# Patient Record
Sex: Male | Born: 1955 | Race: White | Hispanic: No | Smoking: Current every day smoker
Health system: Southern US, Community
[De-identification: ages and names within clinical notes are randomized; demographics above are authoritative.]

## PROBLEM LIST (undated history)

## (undated) DIAGNOSIS — Z87442 Personal history of urinary calculi: Secondary | ICD-10-CM

## (undated) DIAGNOSIS — R519 Headache, unspecified: Secondary | ICD-10-CM

## (undated) DIAGNOSIS — F17200 Nicotine dependence, unspecified, uncomplicated: Secondary | ICD-10-CM

## (undated) DIAGNOSIS — Z8709 Personal history of other diseases of the respiratory system: Secondary | ICD-10-CM

## (undated) DIAGNOSIS — R51 Headache: Secondary | ICD-10-CM

## (undated) DIAGNOSIS — J449 Chronic obstructive pulmonary disease, unspecified: Secondary | ICD-10-CM

## (undated) DIAGNOSIS — Z9289 Personal history of other medical treatment: Secondary | ICD-10-CM

## (undated) DIAGNOSIS — G43909 Migraine, unspecified, not intractable, without status migrainosus: Secondary | ICD-10-CM

## (undated) DIAGNOSIS — Z8619 Personal history of other infectious and parasitic diseases: Secondary | ICD-10-CM

## (undated) HISTORY — DX: Personal history of urinary calculi: Z87.442

## (undated) HISTORY — DX: Headache, unspecified: R51.9

## (undated) HISTORY — PX: OTHER SURGICAL HISTORY: SHX169

## (undated) HISTORY — DX: Chronic obstructive pulmonary disease, unspecified: J44.9

## (undated) HISTORY — DX: Migraine, unspecified, not intractable, without status migrainosus: G43.909

## (undated) HISTORY — DX: Personal history of other infectious and parasitic diseases: Z86.19

## (undated) HISTORY — PX: KNEE ARTHROSCOPY: SUR90

## (undated) HISTORY — DX: Personal history of other diseases of the respiratory system: Z87.09

## (undated) HISTORY — DX: Headache: R51

## (undated) HISTORY — DX: Personal history of other medical treatment: Z92.89

## (undated) HISTORY — DX: Nicotine dependence, unspecified, uncomplicated: F17.200

## (undated) HISTORY — PX: HERNIA REPAIR: SHX51

---
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
## 2006-01-13 ENCOUNTER — Emergency Department: Payer: Self-pay | Admitting: Internal Medicine

## 2006-01-30 ENCOUNTER — Ambulatory Visit: Payer: Self-pay | Admitting: Physician Assistant

## 2007-12-20 ENCOUNTER — Emergency Department: Payer: Self-pay | Admitting: Emergency Medicine

## 2008-07-18 HISTORY — PX: OTHER SURGICAL HISTORY: SHX169

## 2008-07-18 HISTORY — PX: CT HEAD LIMITED W/CM: HXRAD128

## 2008-08-09 ENCOUNTER — Emergency Department: Payer: Self-pay | Admitting: Emergency Medicine

## 2008-08-14 ENCOUNTER — Ambulatory Visit: Payer: Self-pay | Admitting: Surgery

## 2009-11-14 IMAGING — CT CT HEAD WITHOUT CONTRAST
1 series · 16 of 30 positions shown, 20 images · non-contrast
Comparison: none

REASON FOR EXAM: headache l side feels funny w/ headache
COMMENTS:

[Series 2: soft tissue · axial · 0.44mm/px · z∈[+1237,+1377]mm · 16 of 32 slices shown, 20 images]
[im 2/32  brain]
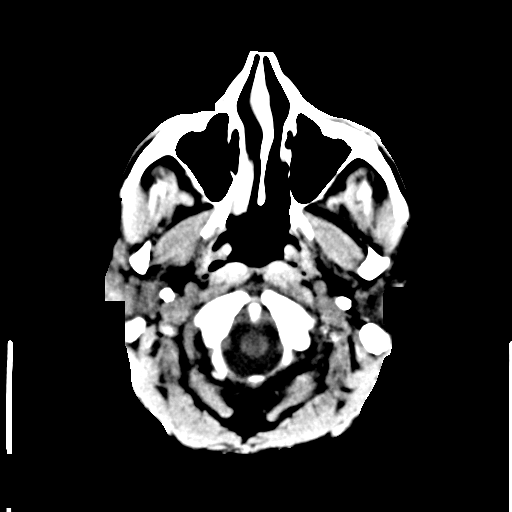
[im 2/32  bone]
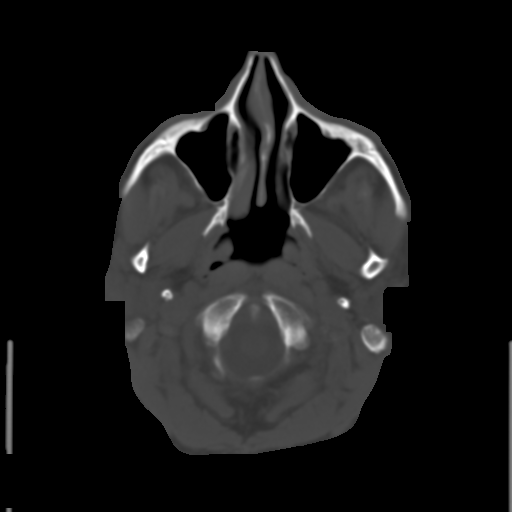
[im 4/32  brain]
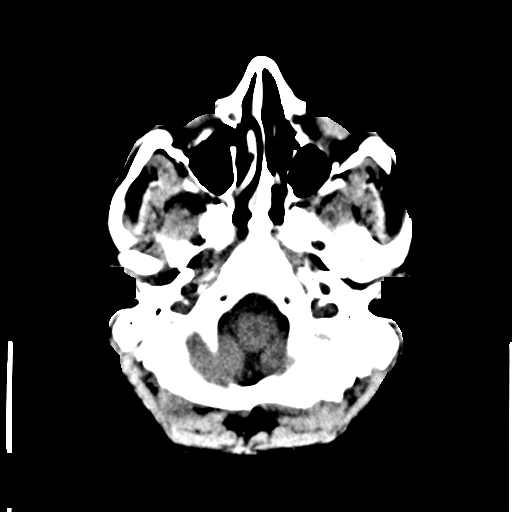
[im 6/32  brain]
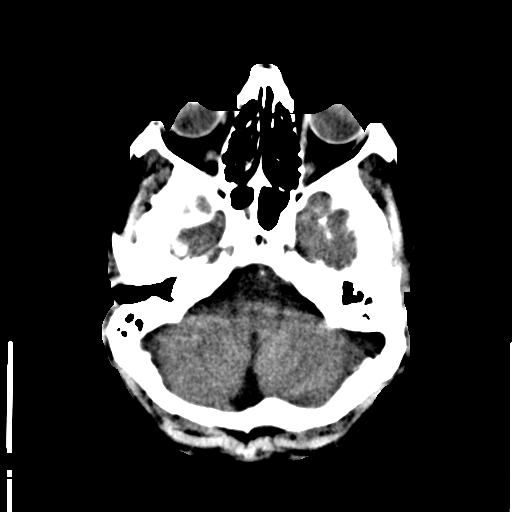
[im 8/32  brain]
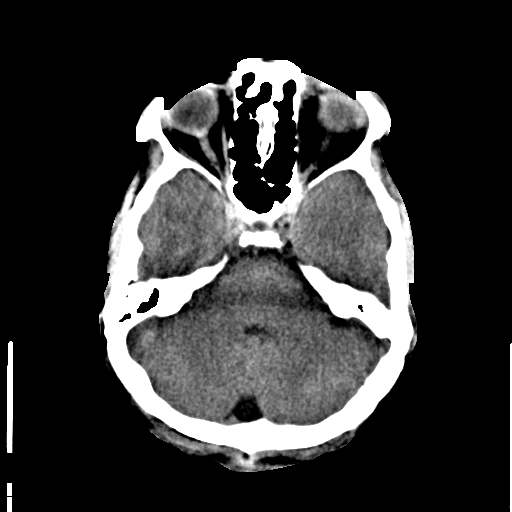
[im 9/32  brain]
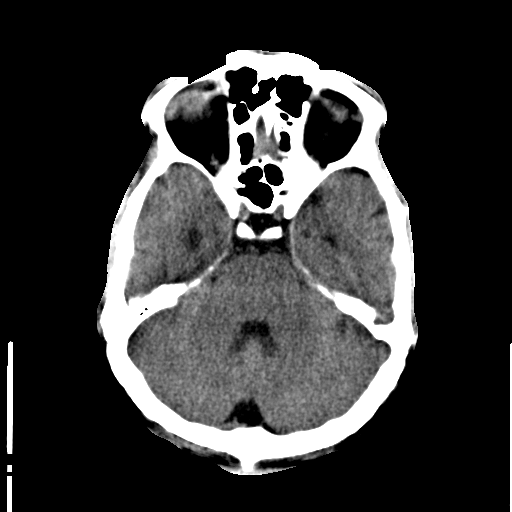
[im 9/32  bone]
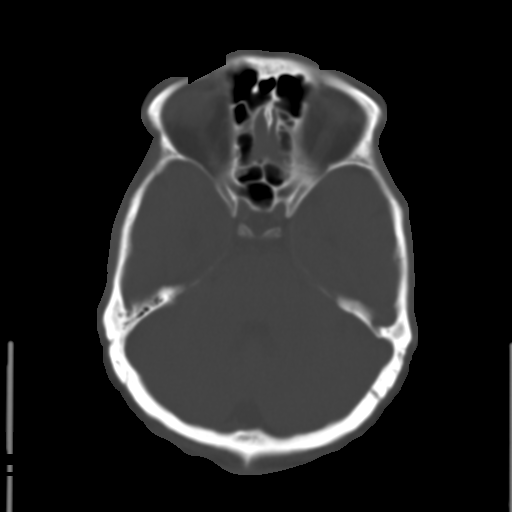
[im 11/32  brain]
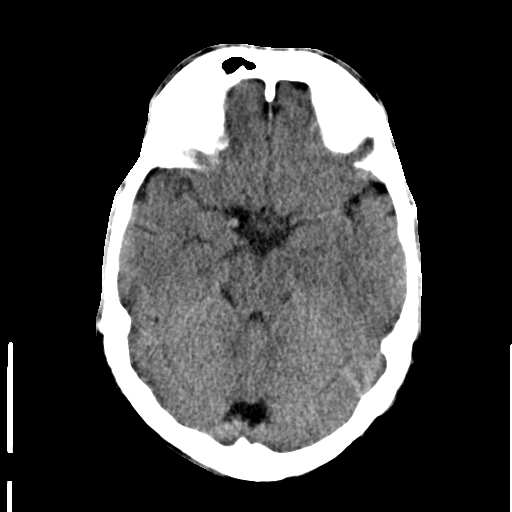
[im 13/32  brain]
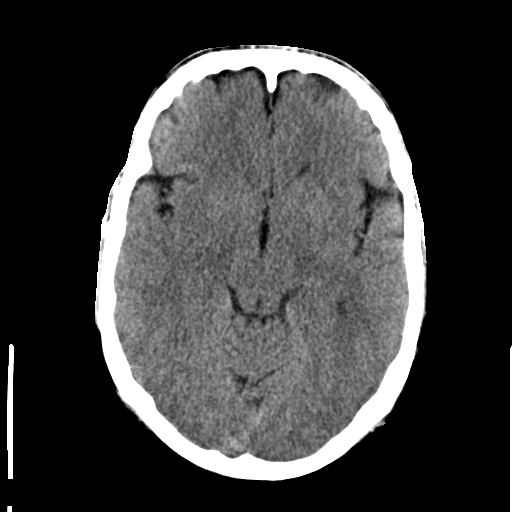
[im 15/32  brain]
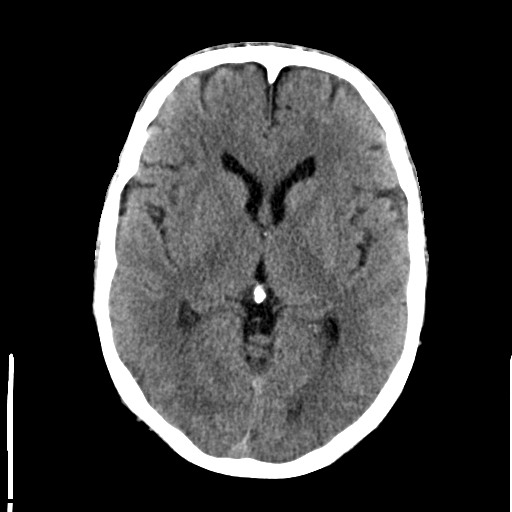
[im 17/32  brain]
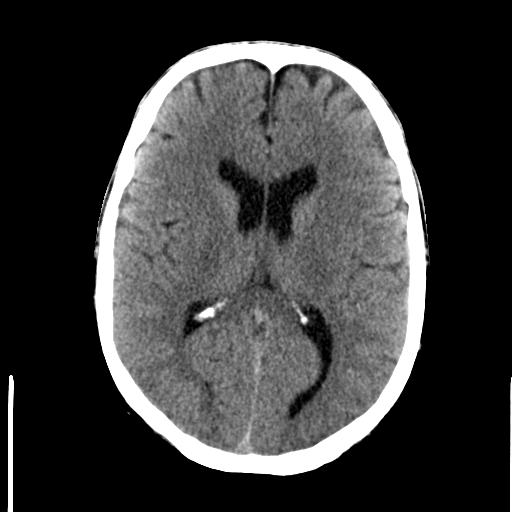
[im 17/32  bone]
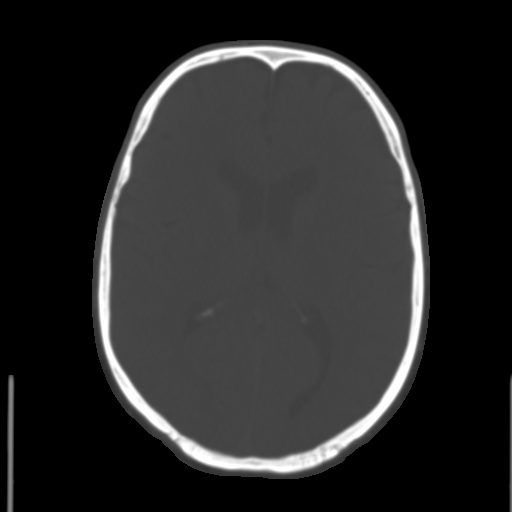
[im 19/32  brain]
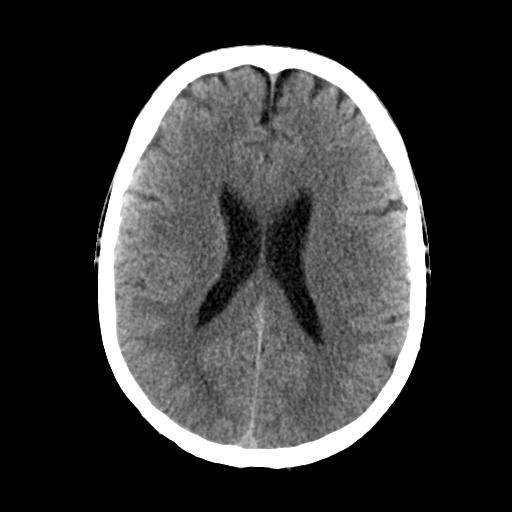
[im 21/32  brain]
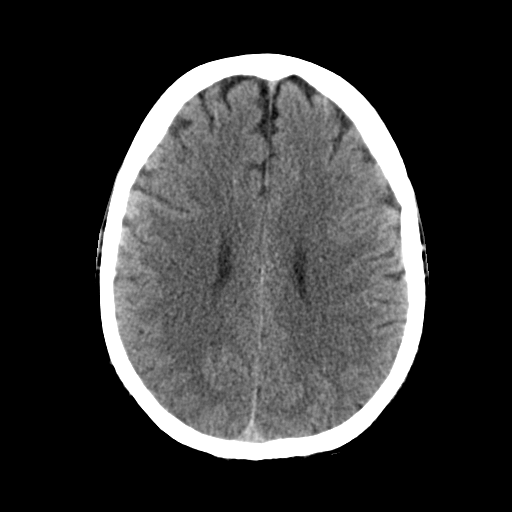
[im 23/32  brain]
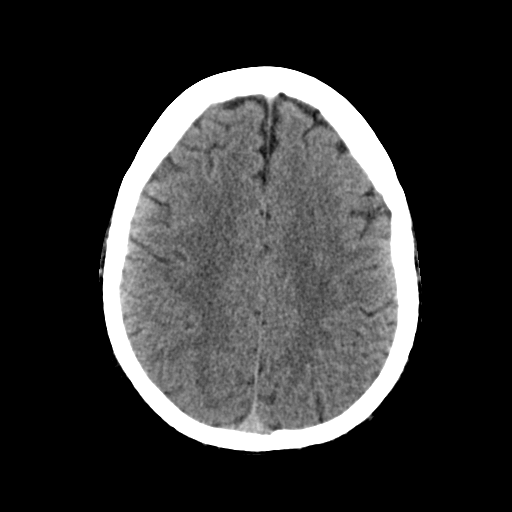
[im 24/32  brain]
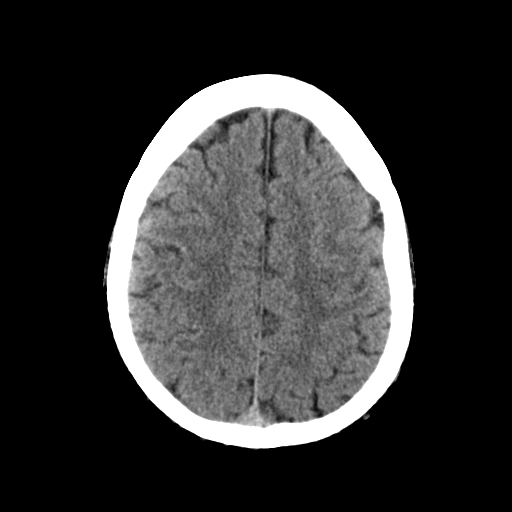
[im 24/32  bone]
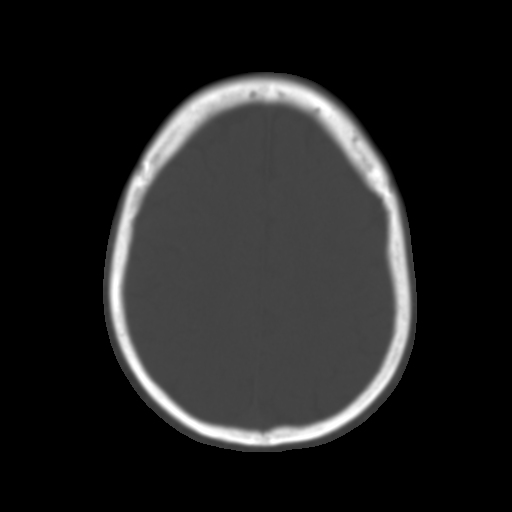
[im 26/32  brain]
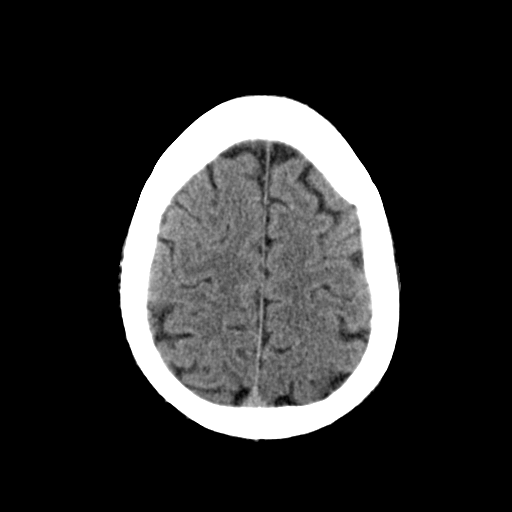
[im 28/32  brain]
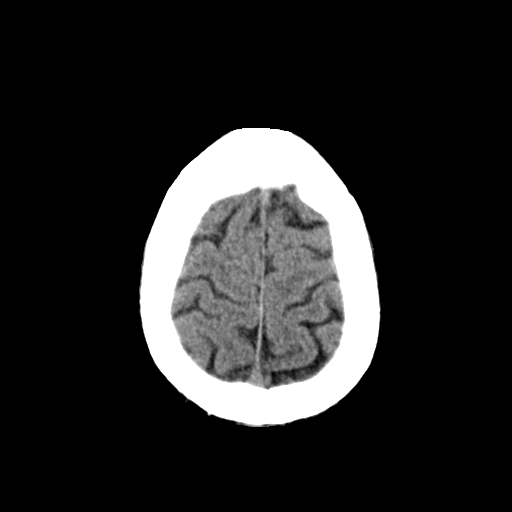
[im 30/32  brain]
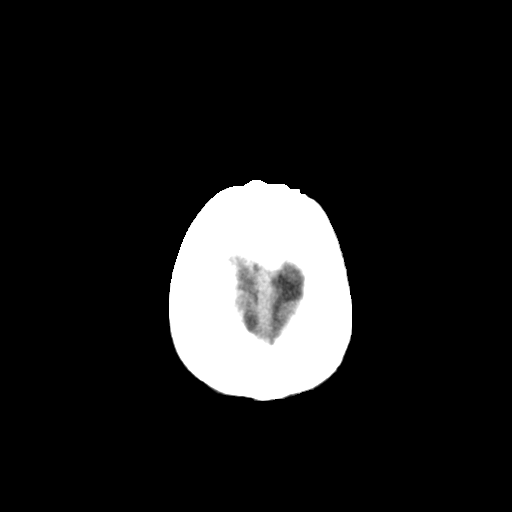

[16 of 30 positions shown; findings below may reference images not displayed]

PROCEDURE:     CT  - CT HEAD WITHOUT CONTRAST  - August 09, 2008  [DATE]

RESULT:     Noncontrast emergent CT of the brain is performed in the
standard fashion. There is no previous examination for comparison.

The ventricles and sulci are normal. There is no hemorrhage. There is no
focal mass, mass-effect or midline shift. There is no evidence of edema or
territorial infarct. The bone windows demonstrate normal aeration of the
paranasal sinuses and mastoid air cells. There is no skull fracture
demonstrated.
IMPRESSION: 1. No acute intracranial abnormality.

## 2010-08-05 ENCOUNTER — Encounter: Payer: Self-pay | Admitting: Family Medicine

## 2010-08-05 ENCOUNTER — Ambulatory Visit (INDEPENDENT_AMBULATORY_CARE_PROVIDER_SITE_OTHER): Payer: Self-pay | Admitting: Family Medicine

## 2010-08-05 VITALS — BP 142/96 | HR 76 | Temp 98.0°F | Ht 66.0 in | Wt 138.1 lb

## 2010-08-05 DIAGNOSIS — G43909 Migraine, unspecified, not intractable, without status migrainosus: Secondary | ICD-10-CM

## 2010-08-05 DIAGNOSIS — F17200 Nicotine dependence, unspecified, uncomplicated: Secondary | ICD-10-CM

## 2010-08-05 DIAGNOSIS — R51 Headache: Secondary | ICD-10-CM

## 2010-08-05 DIAGNOSIS — R52 Pain, unspecified: Secondary | ICD-10-CM

## 2010-08-05 DIAGNOSIS — Z0000 Encounter for general adult medical examination without abnormal findings: Secondary | ICD-10-CM

## 2010-08-05 DIAGNOSIS — Z125 Encounter for screening for malignant neoplasm of prostate: Secondary | ICD-10-CM

## 2010-08-05 LAB — COMPREHENSIVE METABOLIC PANEL
ALT: 23 U/L (ref 0–53)
AST: 21 U/L (ref 0–37)
Albumin: 4.6 g/dL (ref 3.5–5.2)
Alkaline Phosphatase: 102 U/L (ref 39–117)
Calcium: 9.3 mg/dL (ref 8.4–10.5)
Chloride: 101 mEq/L (ref 96–112)
Potassium: 4.9 mEq/L (ref 3.5–5.1)
Sodium: 141 mEq/L (ref 135–145)

## 2010-08-05 LAB — CBC WITH DIFFERENTIAL/PLATELET
Basophils Relative: 0.4 % (ref 0.0–3.0)
Eosinophils Absolute: 0.2 10*3/uL (ref 0.0–0.7)
HCT: 48.7 % (ref 39.0–52.0)
Lymphs Abs: 2.2 10*3/uL (ref 0.7–4.0)
MCHC: 34.7 g/dL (ref 30.0–36.0)
MCV: 101 fl — ABNORMAL HIGH (ref 78.0–100.0)
Monocytes Absolute: 0.7 10*3/uL (ref 0.1–1.0)
Neutrophils Relative %: 69.3 % (ref 43.0–77.0)
Platelets: 235 10*3/uL (ref 150.0–400.0)

## 2010-08-05 LAB — PSA: PSA: 1.27 ng/mL (ref 0.10–4.00)

## 2010-08-05 LAB — LIPID PANEL
LDL Cholesterol: 120 mg/dL — ABNORMAL HIGH (ref 0–99)
Total CHOL/HDL Ratio: 5

## 2010-08-05 NOTE — Patient Instructions (Addendum)
Return in next few weeks for physical. We will request records from [facility]. Blood work fasting today. Best thing you can do for your health is to quit smoking! Call us with questions.  Good to meet you today.

## 2010-08-05 NOTE — Progress Notes (Signed)
Subjective:    Patient ID: Barry Francis, male    DOB: 01/05/1956, 55 y.o.   MRN: [id number]  HPI CC: new patient, establish  No concerns for today.  Previously saw Dr. Vear Clock in [place].  Has been to [facility] for [facility] as well.  Works at [employer] in [place].  This year has been rough, lost 4 family members, best friend to cancer.  Weight loss - keeps losing weight, weight 138lbs, 1 wk ago 140.  1 yr ago 150lbs.  Feels eating fine.  Feels rundown, tired and sore.  No night sweats, fevers/chills.  Body mass index is 22.29 kg/(m^2).  Endorses longstanding back pain as well as leg pain.  Hasn't had xrays in a while.  H/o back problems in past.  H/o DDD and pinched nerve.  Saw [facility] in past for this.  Told possible "split nerve" in left heel.  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  Smoking - 1 1/2 to 2 ppd.  Hasn't tried anything before.  Contemplative.  Preventative: Tetanus shot - unsure, thinks may have had ~2010. Last CPE/blood work in long time. Had DOT 2010. Had black coffee today o/w fasting. Prostate - strong stream, nocturia x 1, if that.   Colon - pretty regular BMs, no blood in stool. No family hx colon, prostate CA.  + lung cance  There is no problem list on file for this patient.  Past Medical History  Diagnosis Date  . History of kidney stones 1975  . Migraines   . Generalized headaches   . History of chicken pox   . History of transfusion of packed red blood cells 1980s  . History of asthma     as child   Past Surgical History  Procedure Date  . Appendectomy 1965  . Hernia repair 1999/2010    both sides  . R forefinger 1972  . Knee arthroscopy     R knee - multiple 1980s; L knee - 2011  . Hospitalization 1982/1998    angina, unsure results, denies blockage found   History  Substance Use Topics  . Smoking status: Current Everyday Smoker -- 2.0 packs/day for 45 years    Types: Cigarettes  . Smokeless tobacco: Never Used   Comment: 1.5 to 2 PPD  . Alcohol Use: Yes     occasionally   Family History  Problem Relation Age of Onset  . Lung cancer Mother 36  . Coronary artery disease Mother   . Alcohol abuse Father   . Coronary artery disease Father 59  . Coronary artery disease Maternal Grandfather   . Diabetes Neg Hx   . Stroke Neg Hx   . Colon cancer Neg Hx   . Prostate cancer Neg Hx   . Cancer Cousin    Allergies  Allergen Reactions  . Demerol Rash and Other (See Comments)    Burning sensation to skin  . Sulfa Antibiotics Nausea Only and Other (See Comments)    Headaches   No current outpatient prescriptions on file prior to visit.     Review of Systems  Constitutional: Negative for fever, chills, activity change, appetite change, fatigue and unexpected weight change.  HENT: Negative for hearing loss and neck pain.   Eyes: Negative for visual disturbance.  Respiratory: Positive for cough (in am) and shortness of breath. Negative for choking, chest tightness and wheezing.   Cardiovascular: Positive for chest pain (endorses cramping in chest). Negative for palpitations and leg swelling.  Gastrointestinal: Negative for  nausea, vomiting, abdominal pain, diarrhea, constipation, blood in stool and abdominal distention.  Genitourinary: Negative for hematuria and difficulty urinating.  Musculoskeletal: Negative for myalgias and arthralgias.  Skin: Negative for rash.  Neurological: Positive for headaches (migraines?). Negative for dizziness, seizures and syncope.  Hematological: Negative for adenopathy. Does not bruise/bleed easily.  Psychiatric/Behavioral: Negative for dysphoric mood. The patient is not nervous/anxious.    Smoker.    Objective:   Physical Exam  Nursing note and vitals reviewed. Constitutional: He is oriented to person, place, and time. He appears well-developed and well-nourished. No distress.  HENT:  Head: Normocephalic and atraumatic.  Right Ear: External ear normal.  Left  Ear: External ear normal.  Nose: Nose normal.  Mouth/Throat: Oropharynx is clear and moist.  Eyes: Conjunctivae and EOM are normal. Pupils are equal, round, and reactive to light.  Neck: Normal range of motion. Neck supple. No thyromegaly present.  Cardiovascular: Normal rate, regular rhythm, normal heart sounds and intact distal pulses.   No murmur heard. Pulses:      Radial pulses are 2+ on the right side, and 2+ on the left side.  Pulmonary/Chest: Effort normal and breath sounds normal. No respiratory distress. He has no wheezes. He has no rales.  Abdominal: Soft. Bowel sounds are normal. He exhibits no distension and no mass. There is no tenderness. There is no rebound and no guarding.  Musculoskeletal: Normal range of motion.  Lymphadenopathy:    He has no cervical adenopathy.  Neurological: He is alert and oriented to person, place, and time.       CN grossly intact, station and gait intact  Skin: Skin is warm and dry. No rash noted.  Psychiatric: He has a normal mood and affect. His behavior is normal. Judgment and thought content normal.          Assessment & Plan:

## 2010-08-06 ENCOUNTER — Encounter: Payer: Self-pay | Admitting: Family Medicine

## 2010-08-06 DIAGNOSIS — R52 Pain, unspecified: Secondary | ICD-10-CM | POA: Insufficient documentation

## 2010-08-06 DIAGNOSIS — Z0000 Encounter for general adult medical examination without abnormal findings: Secondary | ICD-10-CM | POA: Insufficient documentation

## 2010-08-06 DIAGNOSIS — F17200 Nicotine dependence, unspecified, uncomplicated: Secondary | ICD-10-CM | POA: Insufficient documentation

## 2010-08-06 DIAGNOSIS — Z125 Encounter for screening for malignant neoplasm of prostate: Secondary | ICD-10-CM | POA: Insufficient documentation

## 2010-08-06 DIAGNOSIS — G43909 Migraine, unspecified, not intractable, without status migrainosus: Secondary | ICD-10-CM | POA: Insufficient documentation

## 2010-08-06 NOTE — Assessment & Plan Note (Signed)
Generalized. Review work up done at [facility]. Likely obtain CXR next visit given longstanding h/o somking and fmhx lung CA.

## 2010-08-06 NOTE — Assessment & Plan Note (Signed)
Discussed importance of smoking cessation. Discussed some cessation methods. Pt states will try to cut back by next visit.

## 2010-08-06 NOTE — Assessment & Plan Note (Signed)
Blood work today. Briefly reviewed prostate screening, pt opts to test.

## 2010-08-12 ENCOUNTER — Encounter: Payer: Self-pay | Admitting: Family Medicine

## 2010-08-14 ENCOUNTER — Encounter: Payer: Self-pay | Admitting: Family Medicine

## 2010-08-15 ENCOUNTER — Emergency Department: Payer: Self-pay | Admitting: Internal Medicine

## 2010-08-19 ENCOUNTER — Ambulatory Visit (INDEPENDENT_AMBULATORY_CARE_PROVIDER_SITE_OTHER)
Admission: RE | Admit: 2010-08-19 | Discharge: 2010-08-19 | Disposition: A | Discharge status: Home / Self Care | Payer: PRIVATE HEALTH INSURANCE | Source: Ambulatory Visit | Attending: Family Medicine | Admitting: Family Medicine

## 2010-08-19 ENCOUNTER — Ambulatory Visit: Payer: PRIVATE HEALTH INSURANCE | Admitting: Family Medicine

## 2010-08-19 ENCOUNTER — Ambulatory Visit (INDEPENDENT_AMBULATORY_CARE_PROVIDER_SITE_OTHER): Payer: PRIVATE HEALTH INSURANCE | Admitting: Family Medicine

## 2010-08-19 ENCOUNTER — Encounter: Payer: Self-pay | Admitting: Family Medicine

## 2010-08-19 DIAGNOSIS — Z1211 Encounter for screening for malignant neoplasm of colon: Secondary | ICD-10-CM

## 2010-08-19 DIAGNOSIS — F17200 Nicotine dependence, unspecified, uncomplicated: Secondary | ICD-10-CM

## 2010-08-19 DIAGNOSIS — J449 Chronic obstructive pulmonary disease, unspecified: Secondary | ICD-10-CM

## 2010-08-19 DIAGNOSIS — Z0000 Encounter for general adult medical examination without abnormal findings: Secondary | ICD-10-CM

## 2010-08-19 DIAGNOSIS — R634 Abnormal weight loss: Secondary | ICD-10-CM

## 2010-08-19 DIAGNOSIS — Z125 Encounter for screening for malignant neoplasm of prostate: Secondary | ICD-10-CM

## 2010-08-19 DIAGNOSIS — E785 Hyperlipidemia, unspecified: Secondary | ICD-10-CM

## 2010-08-19 NOTE — Assessment & Plan Note (Addendum)
Discussed healthy living/eating. Reviewed blood work in detail. LDL 120, goal <100 given fm hx. Thinks tetanus done 2010.

## 2010-08-19 NOTE — Progress Notes (Signed)
Subjective:    Patient ID: Barry Francis, male    DOB: January 19, 1956, 55 y.o.   MRN: [id number]  HPI CC: CPE  L index finger infection after cat scratch, seen at [facility] ER.  Started on keflex, told to take 10 days.  No fevers/chills, spreading redness.  Edema down.  Seems to be improving.  No other questions/concerns.  No cough.  + sweating at night back of head.  Endorses 15 lb weight loss over last year.  + SOB with exhertion longstanding. Wt Readings from Last 3 Encounters:  08/19/10 139 lb 0.6 oz (63.068 kg)  08/05/10 138 lb 1.9 oz (62.651 kg)  Smoking - down to <1 ppd.  Previously 2 ppd.  Doesn't want to try anything to help him quit.  Doesn't like pills.  Preventative: Tetanus - thinks had around 2010.   Colon - would like to be set up with colonoscopy.  No family hx colon cancer, no blood in stool, regular stools. + lung cancer in family (mother), and patient is a smoker.  Medications and allergies reviewed and updated in chart. Patient Active Problem List  Diagnoses  . Migraines  . Generalized headaches  . Smoker  . Body aches  . Healthcare maintenance  . Special screening for malignant neoplasm of prostate   Past Medical History  Diagnosis Date  . History of kidney stones 1975  . Migraines   . Generalized headaches   . History of chicken pox   . History of transfusion of packed red blood cells 1980s  . History of asthma     as child  . Smoker    Past Surgical History  Procedure Date  . Appendectomy 1965  . Hernia repair 1999/2010    both sides  . R forefinger 1972  . Knee arthroscopy     R knee - multiple 1980s; L knee - 2011  . Hospitalization 1982/1998    angina, unsure results, denies blockage found  . Cxr 07/2008    no acute process  . Ct head limited w/cm 07/2008    no acute process   History  Substance Use Topics  . Smoking status: Current Everyday Smoker -- 2.0 packs/day for 45 years    Types: Cigarettes  . Smokeless tobacco: Never Used   Comment: 1.5 to 2 PPD  . Alcohol Use: Yes     occasionally   Family History  Problem Relation Age of Onset  . Lung cancer Mother 22  . Coronary artery disease Mother   . Alcohol abuse Father   . Coronary artery disease Father 76  . Coronary artery disease Maternal Grandfather   . Diabetes Neg Hx   . Stroke Neg Hx   . Colon cancer Neg Hx   . Prostate cancer Neg Hx   . Cancer Cousin    Allergies  Allergen Reactions  . Demerol Rash and Other (See Comments)    Burning sensation to skin  . Sulfa Antibiotics Nausea Only and Other (See Comments)    Headaches   No current outpatient prescriptions on file prior to visit.   Review of Systems  Constitutional: Negative for fever, chills, activity change, appetite change, fatigue and unexpected weight change.  HENT: Negative for hearing loss and neck pain.   Respiratory: Negative for cough, chest tightness, shortness of breath and wheezing.   Cardiovascular: Negative for chest pain, palpitations and leg swelling.  Gastrointestinal: Negative for nausea, vomiting, abdominal pain, diarrhea, constipation, blood in stool and abdominal distention.  Genitourinary: Negative for  hematuria and difficulty urinating.  Musculoskeletal: Negative for myalgias and arthralgias.  Skin: Negative for rash.  Neurological: Negative for dizziness, seizures, syncope and headaches.  Hematological: Does not bruise/bleed easily.  Psychiatric/Behavioral: Negative for dysphoric mood. The patient is not nervous/anxious.        Objective:   Physical Exam  Nursing note and vitals reviewed. Constitutional: He is oriented to person, place, and time. He appears well-developed and well-nourished. No distress.       thin  HENT:  Head: Normocephalic and atraumatic.  Right Ear: External ear normal.  Left Ear: External ear normal.  Nose: Nose normal.  Mouth/Throat: Oropharynx is clear and moist.  Eyes: Conjunctivae and EOM are normal. Pupils are equal, round, and  reactive to light.  Neck: Normal range of motion. Neck supple. No thyromegaly present.  Cardiovascular: Normal rate, regular rhythm, normal heart sounds and intact distal pulses.   No murmur heard. Pulses:      Radial pulses are 2+ on the right side, and 2+ on the left side.  Pulmonary/Chest: Effort normal and breath sounds normal. No respiratory distress. He has no wheezes. He has no rales.       coarse  Abdominal: Soft. Bowel sounds are normal. He exhibits no distension and no mass. There is no tenderness. There is no rebound and no guarding.  Genitourinary: Rectum normal. Rectal exam shows no external hemorrhoid, no internal hemorrhoid, no mass, no tenderness and anal tone normal. Guaiac negative stool. Prostate is enlarged (~30gm). Prostate is not tender.  Musculoskeletal: Normal range of motion.  Lymphadenopathy:    He has no cervical adenopathy.  Neurological: He is alert and oriented to person, place, and time.       CN grossly intact, station and gait intact  Skin: Skin is warm and dry. No rash noted.  Psychiatric: He has a normal mood and affect. His behavior is normal. Judgment and thought content normal.          Assessment & Plan:

## 2010-08-19 NOTE — Assessment & Plan Note (Signed)
Discussed smoking cessation, reasons and methods. 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  Trial of nicotrol inhaler, sample provided.  Pt will consider ecigarette. Has cut down on own from 2 ppd to 1 ppd Encouraged and congratulated. ~5 min spent discussing smoking cessation.

## 2010-08-19 NOTE — Assessment & Plan Note (Signed)
Check CXR given also endorsed weight loss in longtime smoker, looking clear but likely COPD. Return for spirometry.

## 2010-08-19 NOTE — Assessment & Plan Note (Signed)
Discussed lipids.  Low HDL, high LDL, rec aerobic exercise and low chol diet.

## 2010-08-19 NOTE — Patient Instructions (Addendum)
X Ray today - I see COPD changes.  I would like you to return in next few months for spirometry (lung function study).  If any chantge in plan based on radiologist read, we will call you. Pass by Marion's office to schedule colonoscopy. Good job with cutting back on smoking!  Nicotrol inhaler provided to try.  Minimize smoking while on this (don't want to get double the nicotine.) Work on lowering bad cholesterol with diet. Good to see you today, call us with questions.  Chronic Obstructive Pulmonary Disease (COPD) Chronic obstructive pulmonary disease (COPD) is a condition in which airflow from the lungs is restricted. The lungs can never return to normal, but there are measures you can take which will improve them and make you feel better. CAUSES  Smoking.   Breathing in irritants (pollution, cigarette smoke, strong odors, aerosol sprays, paint fumes).   History of lung infections.  TREATMENT  Treatment focuses on making you comfortable (supportive care).  HOME CARE INSTRUCTIONS  If you smoke, stop smoking. The carbon monoxide buildup in the blood robs you of your already short oxygen supply.   Take medicines (antibiotics) that kill germs as directed.   Avoid antihistamines and cough syrups. They dry up your system and slow down the elimination of secretions. This decreases respiratory capacity and may lead to infections.   Drink enough water and fluids to keep your urine clear or pale yellow. This loosens secretions.   Use humidifiers at home and at your bedside if they do not make breathing difficult.   Receive all protective vaccines your caregiver suggests, especially pneumococcal and influenza.   Use home oxygen as suggested.  SEEK MEDICAL CARE IF:  You develop pus-like mucus (sputum).   You have an oral temperature above 101.   Breathing is more labored or exercise becomes difficult to do.   You are running out of the medicine you take for your breathing.  SEEK  IMMEDIATE MEDICAL CARE IF:  You have a rapid heart rate.   You have agitation, confusion, tremors, or are in a stupor (family members may need to observe this).   It becomes difficult to breathe.   You develop chest pain.   You have an oral temperature above 101, not controlled by medicine.  MAKE SURE YOU:   Understand these instructions.   Will watch your condition.   Will get help right away if you are not doing well or get worse.  Document Released: 11/13/2004 Document Re-Released: 04/30/2009 [facility] Patient Information 2011 [place], [place].

## 2010-08-19 NOTE — Assessment & Plan Note (Signed)
DRE with enlarged prostate, asxs. PSA reassuring.

## 2010-08-19 NOTE — Assessment & Plan Note (Signed)
Set up with colonoscopy per pt preference.

## 2010-08-20 ENCOUNTER — Encounter: Payer: Self-pay | Admitting: Gastroenterology

## 2010-08-30 ENCOUNTER — Ambulatory Visit (AMBULATORY_SURGERY_CENTER): Payer: PRIVATE HEALTH INSURANCE | Admitting: *Deleted

## 2010-08-30 VITALS — Ht 66.0 in | Wt 138.1 lb

## 2010-08-30 DIAGNOSIS — Z1211 Encounter for screening for malignant neoplasm of colon: Secondary | ICD-10-CM

## 2010-08-30 MED ORDER — PEG-KCL-NACL-NASULF-NA ASC-C 100 G PO SOLR: ORAL | Status: AC

## 2010-09-10 ENCOUNTER — Other Ambulatory Visit: Payer: PRIVATE HEALTH INSURANCE | Admitting: Gastroenterology

## 2010-09-10 ENCOUNTER — Telehealth: Payer: Self-pay | Admitting: Gastroenterology

## 2011-11-24 IMAGING — CR DG CHEST 2V
2 series · 2 of 2 positions shown · non-contrast
Comparison: None

CLINICAL DATA: Cough and shortness of breath.  Chest tightness.

CHEST - 2 VIEW

[view not recorded (1 of 2)]
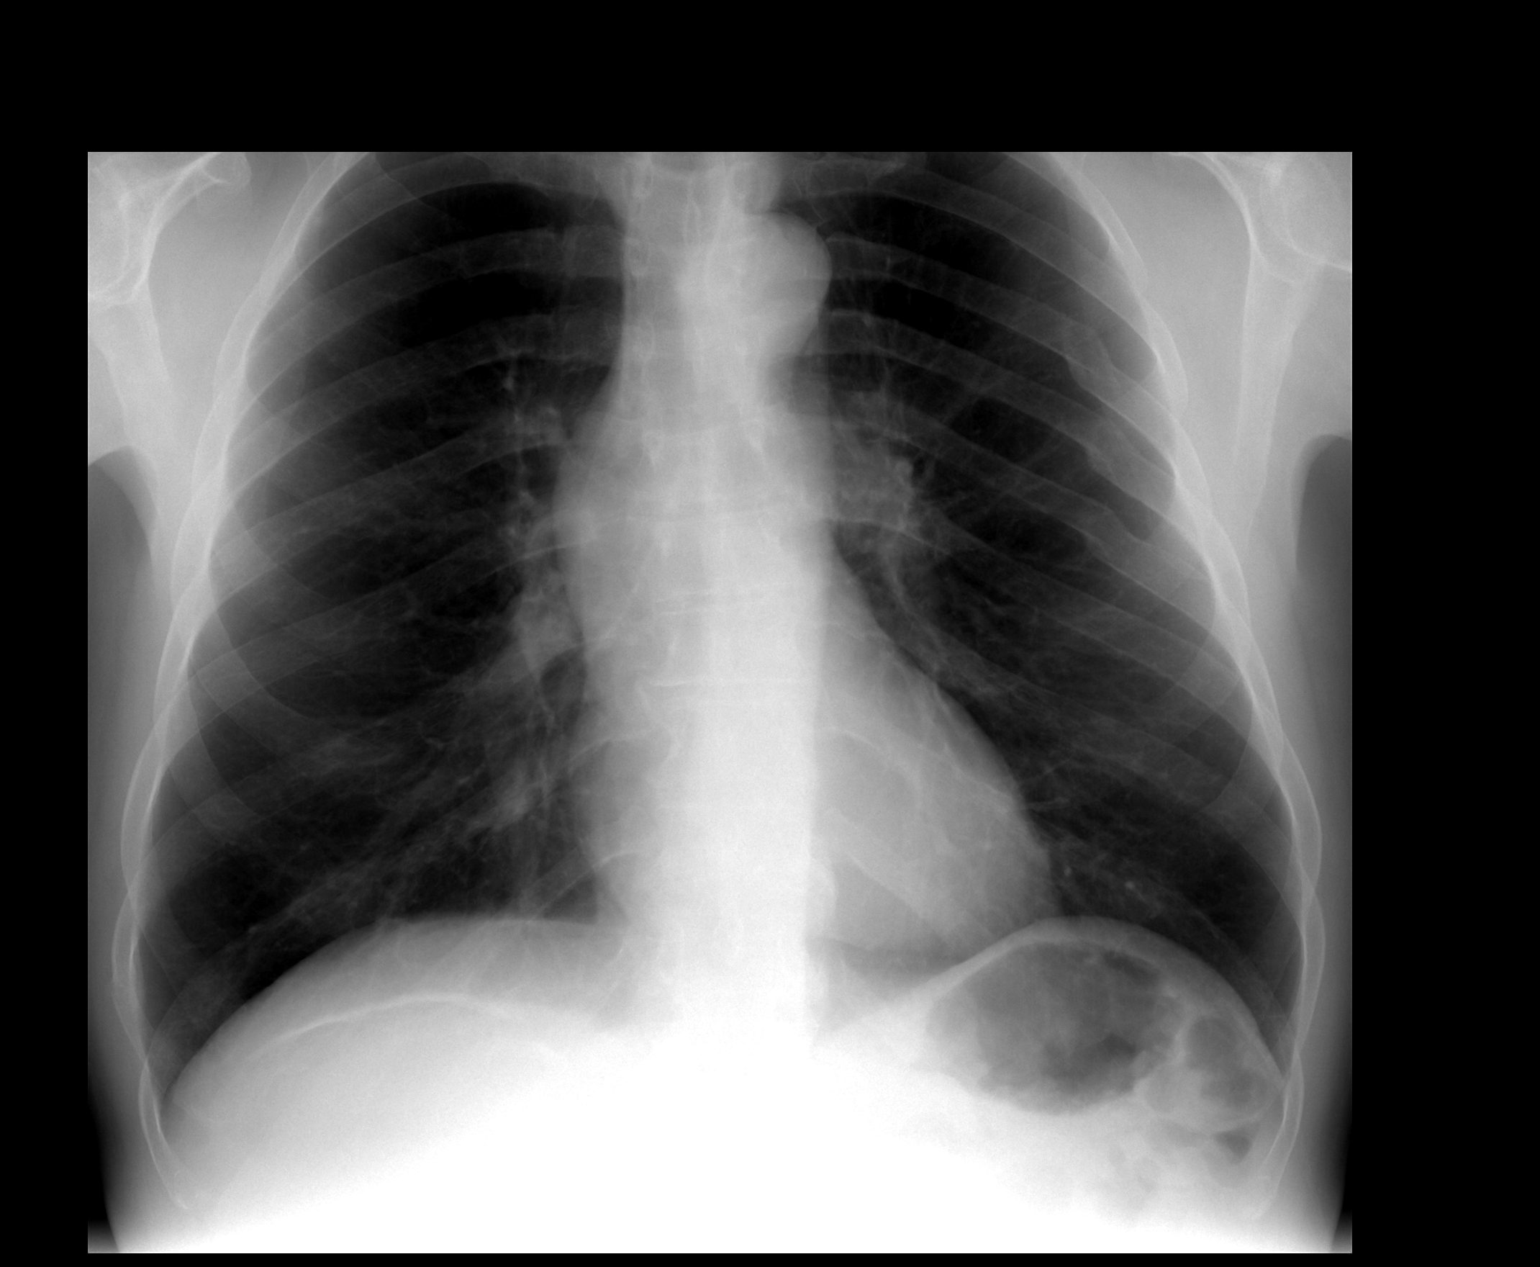

[view not recorded (2 of 2)]
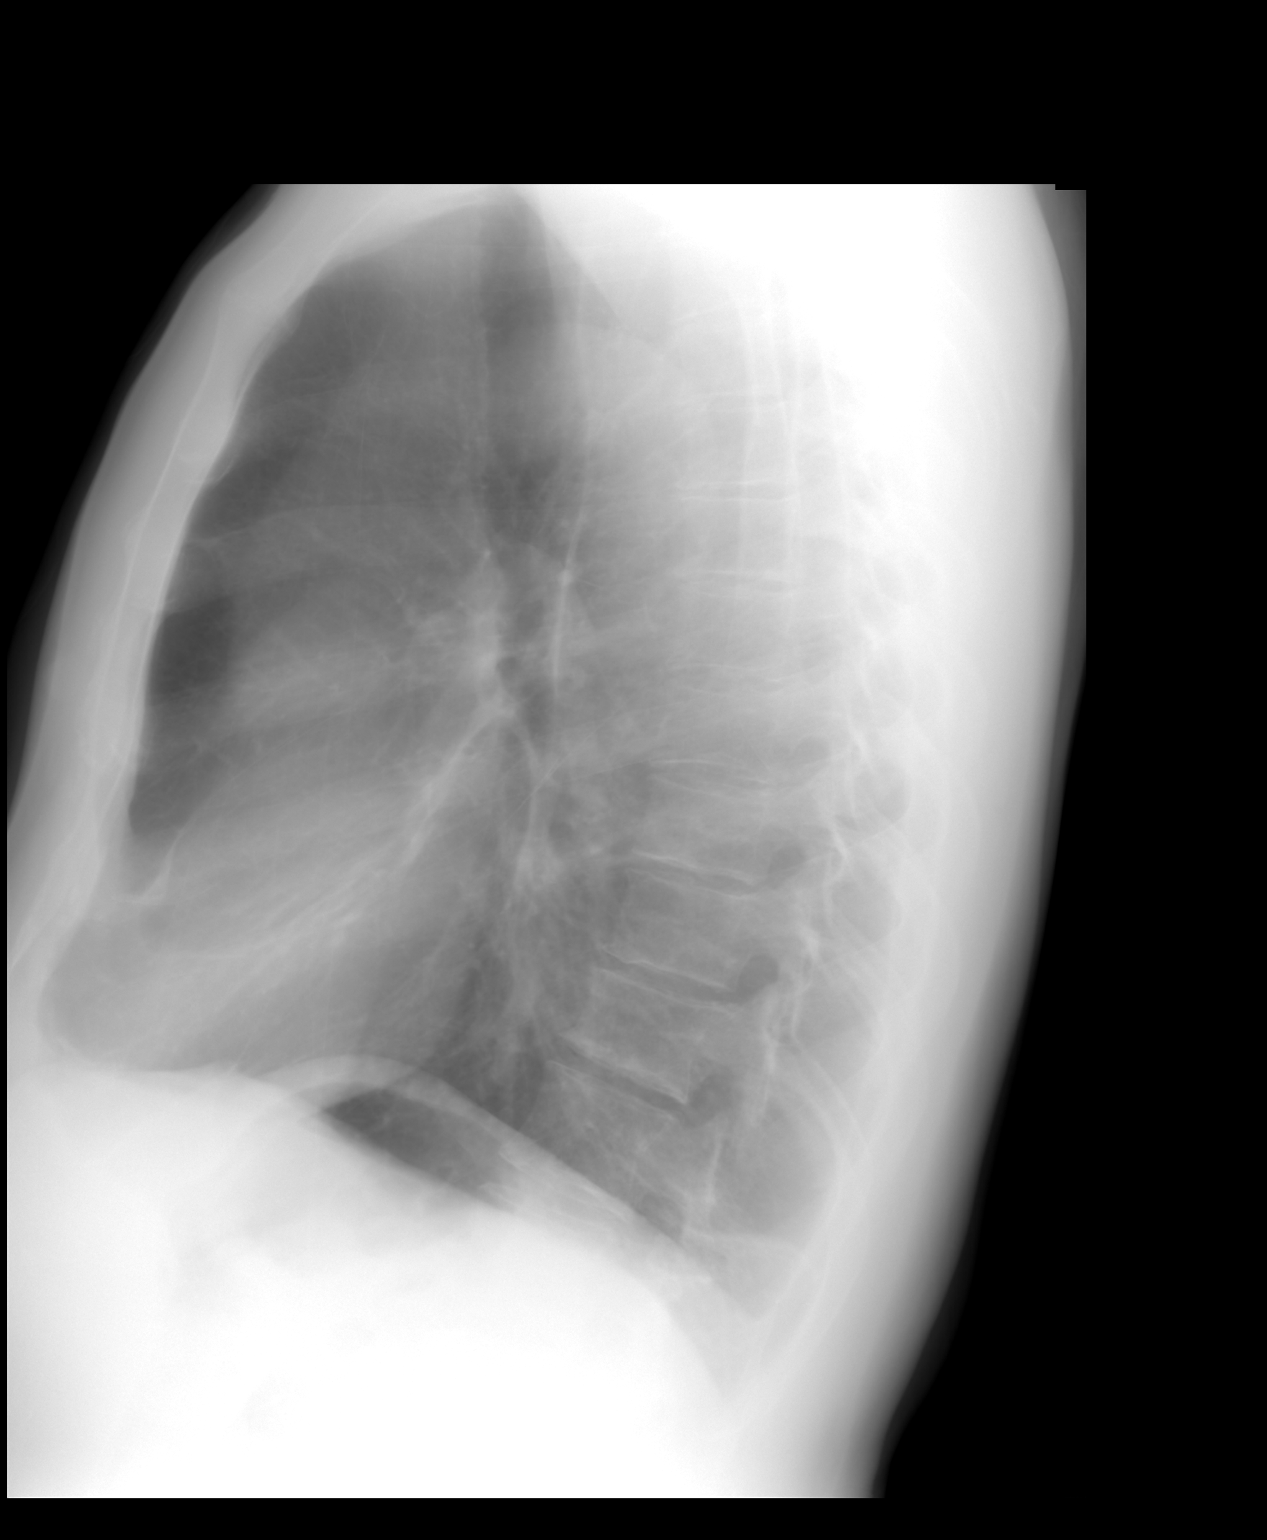

[2 of 2 positions shown; findings below may reference images not displayed]

FINDINGS: Heart size and mediastinal contours are normal.  No
pleural effusion or pulmonary edema.  No airspace consolidation
identified.  There are chronic left posterior lateral rib fracture
deformities.

Bony thorax is otherwise intact.
IMPRESSION: 1.  No acute findings.
2.  Left posterior rib fracture deformities appear chronic.

## 2021-06-17 DEATH — deceased
